# Patient Record
Sex: Female | Born: 2000 | Race: Black or African American | Hispanic: No | Marital: Single | Smoking: Current some day smoker
Health system: Southern US, Community
[De-identification: ages and names within clinical notes are randomized; demographics above are authoritative.]

---
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
## 2018-11-15 IMAGING — CT CT ABD-PELV W/ CM
2 of 4 series · 17 of 46 positions shown, 19 images · IV contrast (APPLIED)
Comparison: None.

CLINICAL DATA: Abdominal pain. Constipation for 10 days. Unable to
void.

EXAM:
CT ABDOMEN AND PELVIS WITH CONTRAST
TECHNIQUE: Multidetector CT imaging of the abdomen and pelvis was performed
using the standard protocol following bolus administration of
intravenous contrast.
CONTRAST:  100mL [id number] IOPAMIDOL ([id number]) INJECTION 61%

[Series 2: axial st · axial · 0.74mm/px · z∈[-651,-266]mm · 14 of 85 slices shown, 16 images]
[im 4/85  soft-tissue]
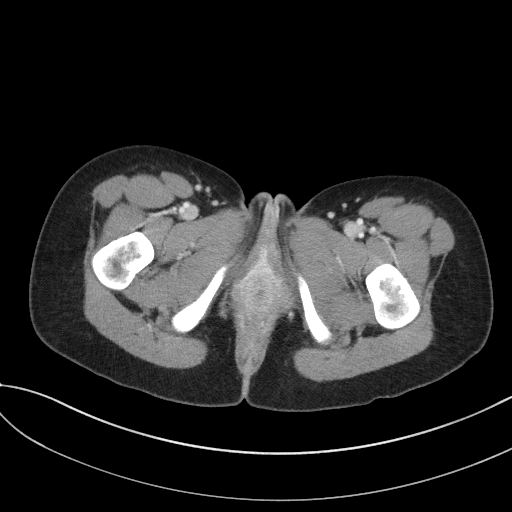
[im 4/85  bone]
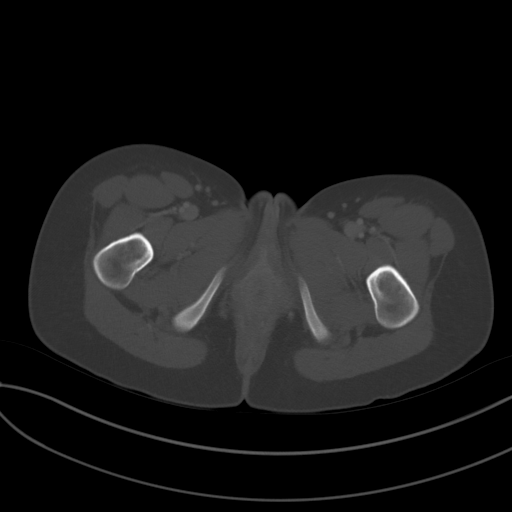
[im 10/85  soft-tissue]
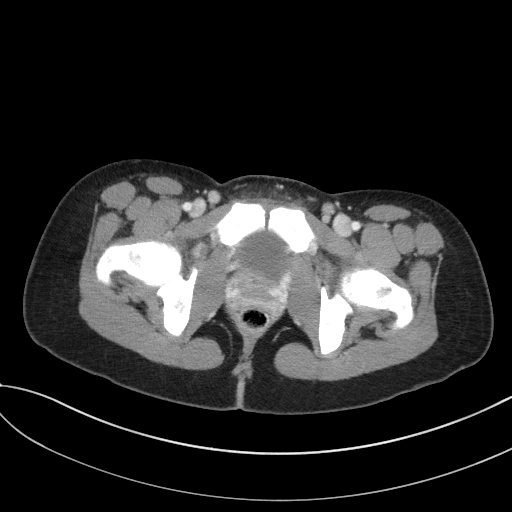
[im 17/85  soft-tissue]
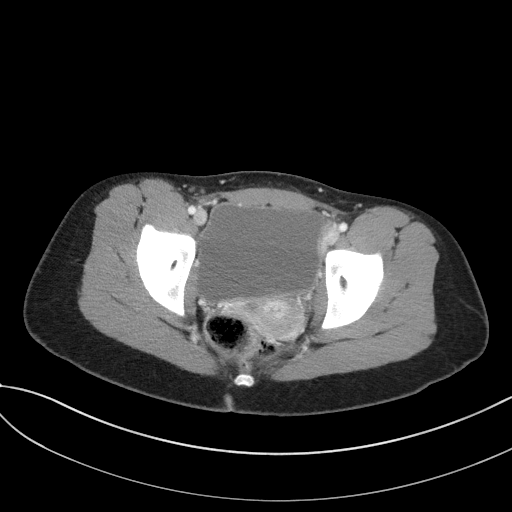
[im 23/85  soft-tissue]
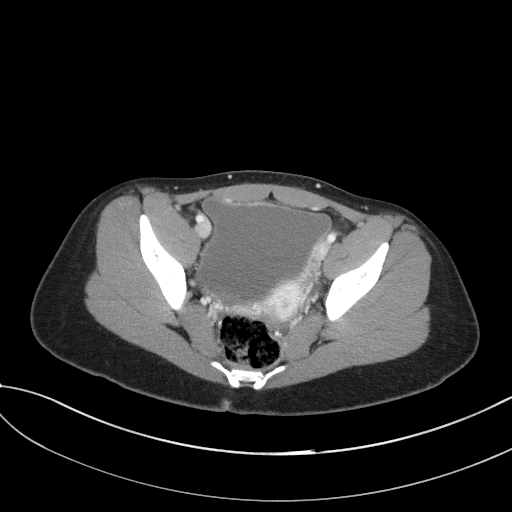
[im 30/85  soft-tissue]
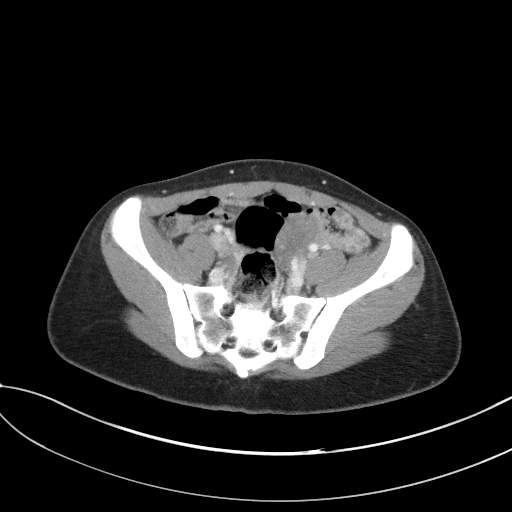
[im 33/85  soft-tissue]
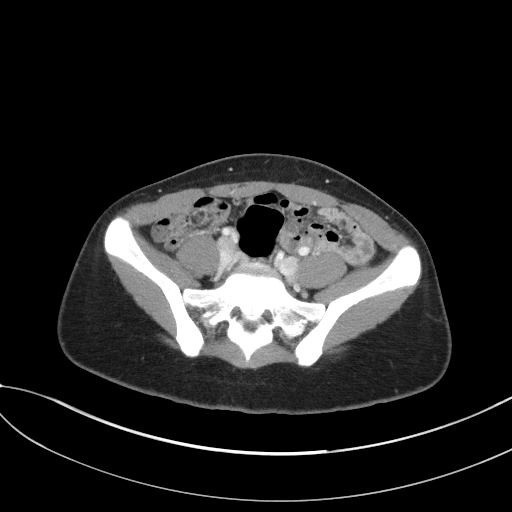
[im 39/85  soft-tissue]
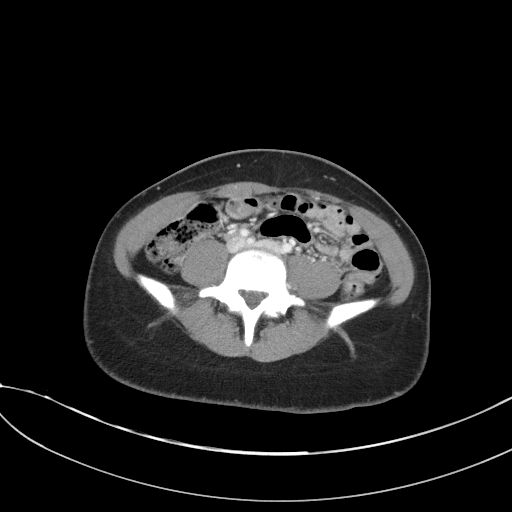
[im 46/85  soft-tissue]
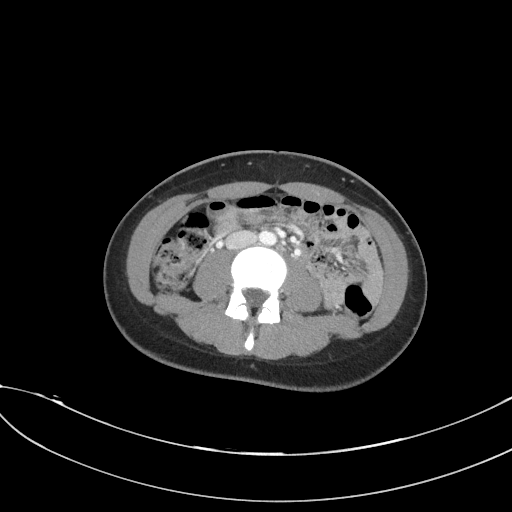
[im 52/85  soft-tissue]
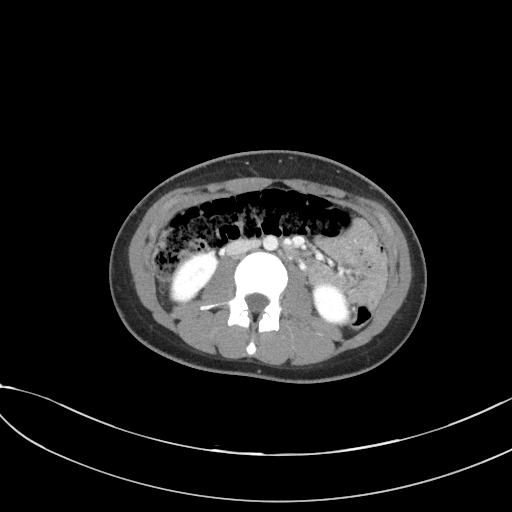
[im 52/85  bone]
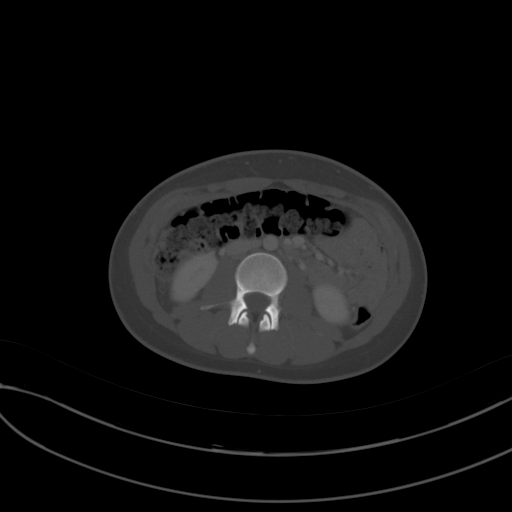
[im 55/85  soft-tissue]
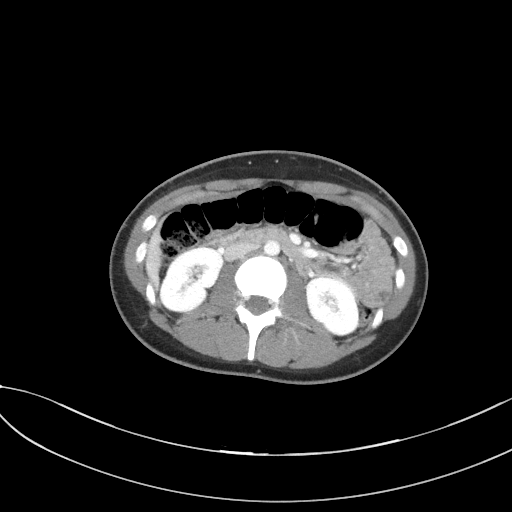
[im 62/85  soft-tissue]
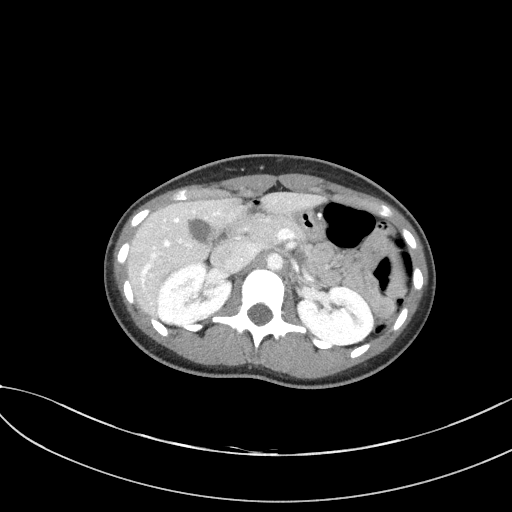
[im 68/85  soft-tissue]
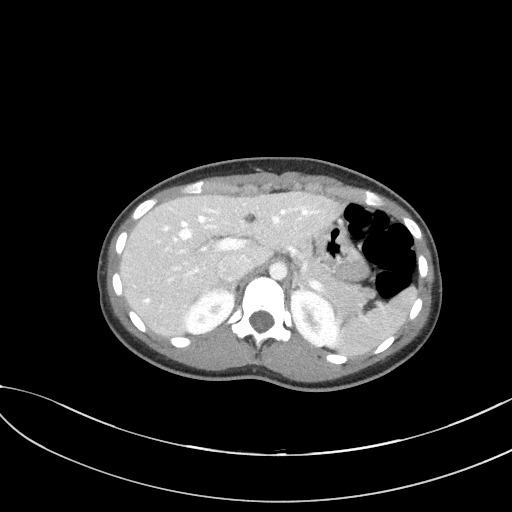
[im 75/85  soft-tissue]
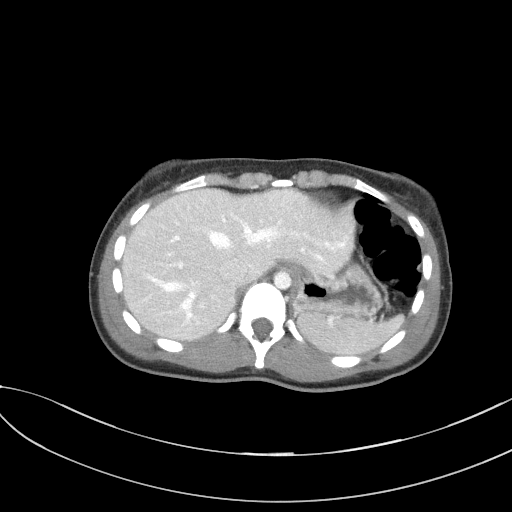
[im 81/85  soft-tissue]
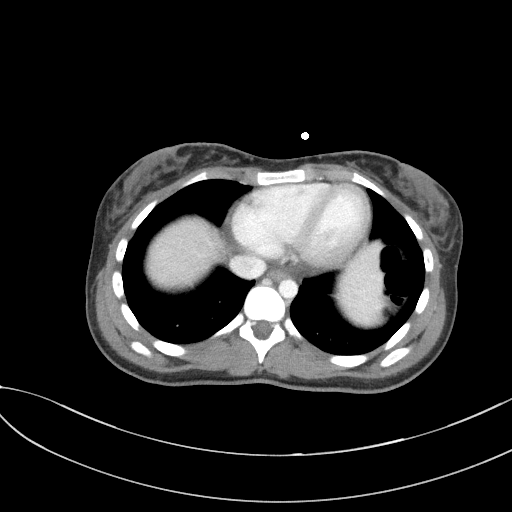

[Series 5: coronal st · coronal · 0.72mm/px · 3 of 58 slices shown]
[im 20/58  soft-tissue]
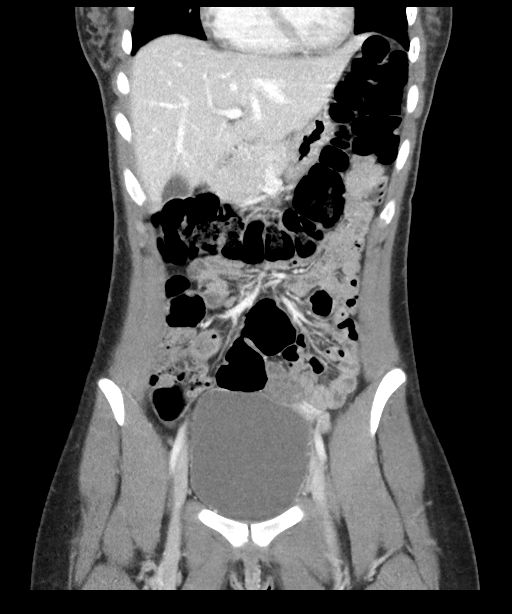
[im 26/58  soft-tissue]
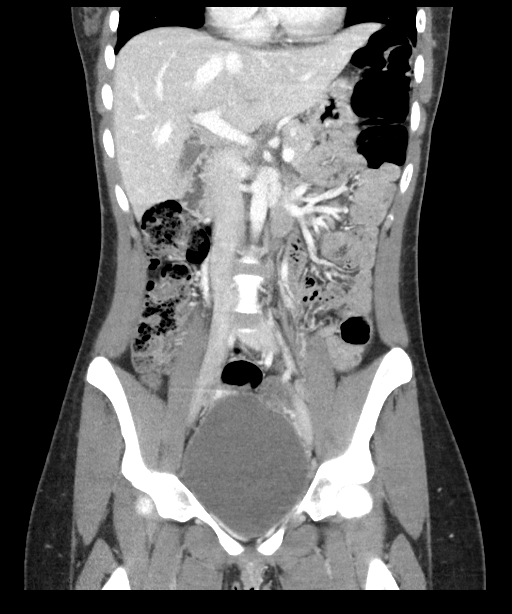
[im 32/58  soft-tissue]
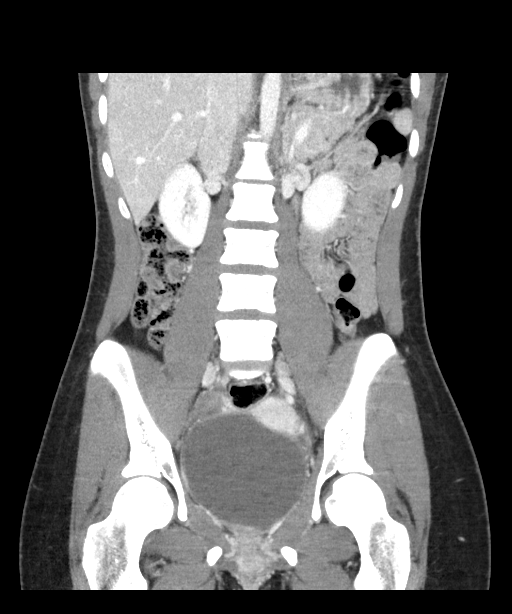

[17 of 46 positions shown; findings below may reference images not displayed]

FINDINGS: Lower Chest: No acute findings.

Hepatobiliary: No hepatic masses identified. Gallbladder is
unremarkable.

Pancreas:  No mass or inflammatory changes.

Spleen: Within normal limits in size and appearance.

Adrenals/Urinary Tract: No masses identified. No evidence of
hydronephrosis.

Stomach/Bowel: No evidence of obstruction, inflammatory process or
abnormal fluid collections. Normal appendix visualized.

Vascular/Lymphatic: No pathologically enlarged lymph nodes. No
abdominal aortic aneurysm.

Reproductive:  No mass or other significant abnormality.

Other:  None.

Musculoskeletal:  No suspicious bone lesions identified.
IMPRESSION: Negative.  No acute findings or other significant abnormality

## 2020-03-09 ENCOUNTER — Emergency Department (HOSPITAL_BASED_OUTPATIENT_CLINIC_OR_DEPARTMENT_OTHER)
Admission: EM | Admit: 2020-03-09 | Discharge: 2020-03-09 | Disposition: A | Discharge status: Home / Self Care | Payer: Medicaid Other | Attending: Emergency Medicine | Admitting: Emergency Medicine

## 2020-03-09 ENCOUNTER — Encounter (HOSPITAL_BASED_OUTPATIENT_CLINIC_OR_DEPARTMENT_OTHER): Payer: Self-pay | Admitting: *Deleted

## 2020-03-09 ENCOUNTER — Other Ambulatory Visit: Payer: Self-pay

## 2020-03-09 DIAGNOSIS — S0501XA Injury of conjunctiva and corneal abrasion without foreign body, right eye, initial encounter: Secondary | ICD-10-CM | POA: Insufficient documentation

## 2020-03-09 DIAGNOSIS — F17200 Nicotine dependence, unspecified, uncomplicated: Secondary | ICD-10-CM | POA: Diagnosis not present

## 2020-03-09 DIAGNOSIS — W228XXA Striking against or struck by other objects, initial encounter: Secondary | ICD-10-CM | POA: Insufficient documentation

## 2020-03-09 DIAGNOSIS — S0591XA Unspecified injury of right eye and orbit, initial encounter: Secondary | ICD-10-CM | POA: Diagnosis present

## 2020-03-09 MED ORDER — FLUORESCEIN SODIUM 1 MG OP STRP
1.0000 | ORAL_STRIP | Freq: Once | OPHTHALMIC | Status: AC
  Administered 2020-03-09: 1 via OPHTHALMIC
  Filled 2020-03-09: qty 1

## 2020-03-09 MED ORDER — TETRACAINE HCL 0.5 % OP SOLN
2.0000 [drp] | Freq: Once | OPHTHALMIC | Status: AC
  Administered 2020-03-09: 2 [drp] via OPHTHALMIC
  Filled 2020-03-09: qty 4

## 2020-03-09 MED ORDER — OFLOXACIN 0.3 % OP SOLN: 1.0000 [drp] | Freq: Four times a day (QID) | OPHTHALMIC | 0 refills | Status: AC

## 2020-03-09 NOTE — ED Provider Notes (Signed)
[facility] EMERGENCY DEPARTMENT Provider Note   CSN: [id number] Arrival date & time: 03/09/20  1550     History Chief Complaint  Patient presents with   Eye Injury    Jacqueline Chang is a 19 y.o. female presenting for evaluation of right eye pain.  Patient states earlier this morning she picked up her 44-month-old son and he accidentally hit her in the right eye.  Since then, she has had gradually worsening pain in her right eye, causing blurry vision.  She has not taken anything for it including Tylenol ibuprofen.  She does not use eyedrops.  She wears glasses, but not contacts.  No previous history of eye problems.  She denies pain in the left eye.  No light sensitivity.  No drainage.  HPI     History reviewed. No pertinent past medical history.  There are no problems to display for this patient.   History reviewed. No pertinent surgical history.   OB History   No obstetric history on file.     No family history on file.  Social History   Tobacco Use   Smoking status: Current Some Day Smoker   Smokeless tobacco: Never Used  Substance Use Topics   Alcohol use: Never   Drug use: Never    Home Medications Prior to Admission medications   Medication Sig Start Date End Date Taking? Authorizing Provider  acyclovir (ZOVIRAX) 400 MG tablet Take 1 tablet by mouth 3 (three) times daily. 10/10/17   [provider]  ofloxacin (OCUFLOX) 0.3 % ophthalmic solution Place 1 drop into the right eye 4 (four) times daily for 5 days. 03/09/20 03/14/20  Marwin Primmer, PA-C  ondansetron (ZOFRAN) 4 MG tablet Take 1 tablet (4 mg total) by mouth every 8 (eight) hours as needed. 10/11/17   Tegeler, Canary Brim, MD  oxyCODONE-acetaminophen (PERCOCET/ROXICET) 5-325 MG tablet Take 1 tablet by mouth every 4 (four) hours as needed for severe pain. 10/11/17   Tegeler, Canary Brim, MD  polyethylene glycol [facility]) packet Take 17 g by mouth daily. 10/11/17    Tegeler, Canary Brim, MD    Allergies    Patient has no known allergies.  Review of Systems   Review of Systems  Constitutional: Negative for fever.  Eyes: Positive for photophobia and pain. Negative for discharge.    Physical Exam Updated Vital Signs BP 105/62 (BP Location: Left Arm)    Pulse 86    Temp 98.6 F (37 C) (Oral)    Resp 16    Ht 5' (1.524 m)    Wt 54.9 kg    SpO2 100%    BMI 23.63 kg/m   Physical Exam Vitals and nursing note reviewed.  Constitutional:      General: She is not in acute distress.    Appearance: She is well-developed.  HENT:     Head: Normocephalic and atraumatic.  Eyes:     Extraocular Movements: Extraocular movements intact.     Conjunctiva/sclera:     Right eye: Right conjunctiva is injected.     Pupils:     Right eye: Corneal abrasion and fluorescein uptake present.     Comments: Mild periorbital swelling of the right.  Injected conjunctive of the right.  Positive fluorescein uptake of the right.  Negative Seidel sign.  EOMI and PERRLA.  Pulmonary:     Effort: Pulmonary effort is normal.  Abdominal:     General: There is no distension.  Musculoskeletal:  General: Normal range of motion.     Cervical back: Normal range of motion.  Skin:    General: Skin is warm.     Findings: No rash.  Neurological:     Mental Status: She is alert and oriented to person, place, and time.     ED Results / Procedures / Treatments   Labs (all labs ordered are listed, but only abnormal results are displayed) Labs Reviewed - No data to display  EKG None  Radiology No results found.  Procedures Procedures (including critical care time)  Medications Ordered in ED Medications  fluorescein ophthalmic strip 1 strip (1 strip Right Eye Given 03/09/20 1632)  tetracaine (PONTOCAINE) 0.5 % ophthalmic solution 2 drop (2 drops Right Eye Given 03/09/20 1632)    ED Course  I have reviewed the triage vital signs and the nursing notes.  Pertinent  labs & imaging results that were available during my care of the patient were reviewed by me and considered in my medical decision making (see chart for details).    MDM Rules/Calculators/A&P                          Patient presented for evaluation of right eye pain.  On exam, patient with a corneal abrasion.  Pain resolved after tetracaine.  Discussed findings with patient.  Discussed use of antibiotic eyedrops, follow-up with ophthalmology as needed.  At this time, patient appears safe for discharge.  Return precautions given.  Patient states she understands and agrees to plan.  Final Clinical Impression(s) / ED Diagnoses Final diagnoses:  Abrasion of right cornea, initial encounter    Rx / DC Orders ED Discharge Orders         Ordered    ofloxacin (OCUFLOX) 0.3 % ophthalmic solution  4 times daily        03/09/20 1637           Carvel Huskins, PA-C 03/09/20 1656    Virgina Norfolk, DO 03/09/20 1714

## 2020-03-09 NOTE — ED Notes (Signed)
ED Provider at bedside. 

## 2020-03-09 NOTE — Discharge Instructions (Addendum)
Take the antibiotic drops as prescribed. Use Tylenol and ibuprofen as needed for pain. Use warm compresses to help with pain and swelling. Do not rub that, itch, or touch her eye, this will make it worse. Follow-up with the ophthalmologist listed below or with your eye doctor if your symptoms not improving in the next 2 days. Return to the emergency room if you develop severe worsening pain, vision loss, pus draining from your eye, double vision/inability to move your eye, or any new, worsening, or concerning symptoms.

## 2020-03-09 NOTE — ED Triage Notes (Signed)
Her son scratched her right eye this am.

## 2021-01-21 ENCOUNTER — Encounter (HOSPITAL_BASED_OUTPATIENT_CLINIC_OR_DEPARTMENT_OTHER): Payer: Self-pay

## 2021-01-21 ENCOUNTER — Other Ambulatory Visit: Payer: Self-pay

## 2021-01-21 ENCOUNTER — Emergency Department (HOSPITAL_BASED_OUTPATIENT_CLINIC_OR_DEPARTMENT_OTHER)
Admission: EM | Admit: 2021-01-21 | Discharge: 2021-01-21 | Disposition: A | Discharge status: Home / Self Care | Payer: Medicaid Other | Attending: Emergency Medicine | Admitting: Emergency Medicine

## 2021-01-21 DIAGNOSIS — A6004 Herpesviral vulvovaginitis: Secondary | ICD-10-CM | POA: Insufficient documentation

## 2021-01-21 DIAGNOSIS — F17200 Nicotine dependence, unspecified, uncomplicated: Secondary | ICD-10-CM | POA: Insufficient documentation

## 2021-01-21 DIAGNOSIS — R21 Rash and other nonspecific skin eruption: Secondary | ICD-10-CM | POA: Diagnosis present

## 2021-01-21 MED ORDER — ACYCLOVIR 400 MG PO TABS: 400.0000 mg | ORAL_TABLET | Freq: Three times a day (TID) | ORAL | 0 refills | Status: AC

## 2021-01-21 NOTE — ED Triage Notes (Signed)
Pt with hx of HSV, c/o vaginal outbreak since yesterday. Pt also on menstrual cycle currently. Pt denies pain, fever. NAD during triage.

## 2021-01-21 NOTE — Discharge Instructions (Signed)
You were seen here today for a HSV flareup.  You are prescribed acyclovir to take 3 times a day for the next 7 days.  Please refrain from sexual activity until out break has cleared as you may pass on to your partner.  Attached is more information on genital herpes. If you have any concern, new or worsening symptoms please return to the ED.

## 2021-01-21 NOTE — ED Provider Notes (Signed)
[facility] EMERGENCY DEPARTMENT Provider Note   CSN: [id number] Arrival date & time: 01/21/21  4627     History Chief Complaint  Patient presents with   Rash    Jacqueline Chang is a 20 y.o. female with history of genital herpes presents to the emergency department for a vaginal outbreak for the past 2 days.  She reports that this feels like her outbreak she gets and has the tingling/pain beforehand.  She denies any fevers, chills, abdominal pain, pelvic pain, nausea, vomiting, vaginal discharge.  Patient is on Depo-Provera for birth control and is currently on her menstrual cycle.  She denies any medical surgical history.  Denies any daily medications.  No known drug allergies.  She denies any tobacco, EtOH, or illicit drug use.   Rash Associated symptoms: no abdominal pain, no diarrhea, no fever, no joint pain, no nausea, no shortness of breath, no sore throat and not vomiting       History reviewed. No pertinent past medical history.  There are no problems to display for this patient.   History reviewed. No pertinent surgical history.   OB History   No obstetric history on file.     No family history on file.  Social History   Tobacco Use   Smoking status: Some Days   Smokeless tobacco: Never  Substance Use Topics   Alcohol use: Never   Drug use: Never    Home Medications Prior to Admission medications   Medication Sig Start Date End Date Taking? Authorizing Provider  acyclovir (ZOVIRAX) 400 MG tablet Take 1 tablet (400 mg total) by mouth 3 (three) times daily. 01/21/21   Achille Rich, PA-C  ondansetron (ZOFRAN) 4 MG tablet Take 1 tablet (4 mg total) by mouth every 8 (eight) hours as needed. 10/11/17   Tegeler, Canary Brim, MD  oxyCODONE-acetaminophen (PERCOCET/ROXICET) 5-325 MG tablet Take 1 tablet by mouth every 4 (four) hours as needed for severe pain. 10/11/17   Tegeler, Canary Brim, MD  polyethylene glycol [facility]) packet Take 17 g by mouth  daily. 10/11/17   Tegeler, Canary Brim, MD    Allergies    Patient has no known allergies.  Review of Systems   Review of Systems  Constitutional:  Negative for chills and fever.  HENT:  Negative for ear pain and sore throat.   Eyes:  Negative for pain and visual disturbance.  Respiratory:  Negative for cough and shortness of breath.   Cardiovascular:  Negative for chest pain and palpitations.  Gastrointestinal:  Negative for abdominal pain, diarrhea, nausea and vomiting.  Genitourinary:  Positive for genital sores. Negative for dysuria, flank pain, frequency, hematuria, pelvic pain, urgency and vaginal discharge.  Musculoskeletal:  Negative for arthralgias and back pain.  Skin:  Positive for rash. Negative for color change.  Neurological:  Negative for seizures and syncope.  All other systems reviewed and are negative.  Physical Exam Updated Vital Signs BP (!) 127/56   Pulse (!) 110   Temp 98.7 F (37.1 C)   Resp 18   Ht 5' (1.524 m)   Wt 55.8 kg   SpO2 100%   BMI 24.02 kg/m   Physical Exam Vitals and nursing note reviewed. Exam conducted with a chaperone present Astrid Drafts, [employer] fundraiser).  Constitutional:      General: She is not in acute distress.    Appearance: She is well-developed. She is not toxic-appearing.  HENT:     Head: Normocephalic and atraumatic.  Eyes:  Conjunctiva/sclera: Conjunctivae normal.  Cardiovascular:     Rate and Rhythm: Normal rate and regular rhythm.     Heart sounds: No murmur heard.    Comments: Last nursing note pulse rate was 110.  Pulse rate was 96 in the room. Pulmonary:     Effort: Pulmonary effort is normal. No respiratory distress.     Breath sounds: Normal breath sounds.  Abdominal:     Palpations: Abdomen is soft.     Tenderness: There is no abdominal tenderness.  Genitourinary:    Vagina: No vaginal discharge.     Comments: 4 small vesicular lesions on the mid medial right labia majora consistent with HSV.  No weeping.   Menstrual blood present.  No vaginal discharge visualized.  Speculum exam deferred.  No lymphadenopathy Musculoskeletal:        General: No deformity.     Cervical back: Neck supple.  Skin:    General: Skin is warm and dry.  Neurological:     Mental Status: She is alert.    ED Results / Procedures / Treatments   Labs (all labs ordered are listed, but only abnormal results are displayed) Labs Reviewed - No data to display  EKG None  Radiology No results found.  Procedures Procedures   Medications Ordered in ED Medications - No data to display  ED Course  I have reviewed the triage vital signs and the nursing notes.  Pertinent labs & imaging results that were available during my care of the patient were reviewed by me and considered in my medical decision making (see chart for details).   Jacqueline Chang is a 20 year old female presenting for HSV outbreak.  Vesicular rash consistent with HSV on right labia. Patient was seen here previously and given acyclovir she had good results with.  States the patient to not participate in any type of sexual intercourse as she may pass on herpes onto her partner.  Discussed medication and return precautions with patient.  Patient agrees with plan.  Patient stable and being discharged home in good condition.    MDM Rules/Calculators/A&P                          Final Clinical Impression(s) / ED Diagnoses Final diagnoses:  Herpes simplex vulvovaginitis    Rx / DC Orders ED Discharge Orders          Ordered    acyclovir (ZOVIRAX) 400 MG tablet  3 times daily        01/21/21 1114             Achille Rich, [place] 01/21/21 1122    Tegeler, Canary Brim, MD 01/21/21 1218

## 2021-01-21 NOTE — ED Notes (Signed)
First contact with patient. Patient arrived via triage with complaints of vaginal herpes outbreak and needing an RX at this time. Pt is A&OX 4. Respirations even/unlabored. Patient changed into gown and placed on monitor and call light within reach. Patient updated on plan of care. Will continue to monitor patient.

## 2022-04-01 ENCOUNTER — Encounter (HOSPITAL_BASED_OUTPATIENT_CLINIC_OR_DEPARTMENT_OTHER): Payer: Self-pay | Admitting: Urology

## 2022-04-01 ENCOUNTER — Emergency Department (HOSPITAL_BASED_OUTPATIENT_CLINIC_OR_DEPARTMENT_OTHER)
Admission: EM | Admit: 2022-04-01 | Discharge: 2022-04-01 | Discharge status: Against Medical Advice | Payer: Medicaid Other | Attending: Emergency Medicine | Admitting: Emergency Medicine

## 2022-04-01 DIAGNOSIS — Z5321 Procedure and treatment not carried out due to patient leaving prior to being seen by health care provider: Secondary | ICD-10-CM | POA: Insufficient documentation

## 2022-04-01 DIAGNOSIS — Z202 Contact with and (suspected) exposure to infections with a predominantly sexual mode of transmission: Secondary | ICD-10-CM | POA: Diagnosis present

## 2022-04-01 NOTE — ED Triage Notes (Signed)
Pt states LLQ abdominal pain for 2 months  Denies N/V  Pt states normal BM, last was today  Denies fever

## 2022-04-01 NOTE — ED Notes (Signed)
Pt reports to registration that she is leaving and can no longer wait to be seen

## 2022-04-01 NOTE — ED Triage Notes (Signed)
Pt now states not here to be seen for abdominal pain, wants to be seen for herpes outbreak in genitals.
